# Patient Record
Sex: Male | Born: 1995 | Race: White | Hispanic: No | Marital: Single | Smoking: Never smoker
Health system: Southern US, Community
[De-identification: ages and names within clinical notes are randomized; demographics above are authoritative.]

---
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
## 2016-03-20 ENCOUNTER — Emergency Department (HOSPITAL_COMMUNITY): Payer: BLUE CROSS/BLUE SHIELD

## 2016-03-20 ENCOUNTER — Encounter (HOSPITAL_COMMUNITY): Payer: Self-pay | Admitting: Emergency Medicine

## 2016-03-20 ENCOUNTER — Emergency Department (HOSPITAL_COMMUNITY)
Admission: EM | Admit: 2016-03-20 | Discharge: 2016-03-20 | Disposition: A | Discharge status: Home / Self Care | Payer: BLUE CROSS/BLUE SHIELD | Attending: Emergency Medicine | Admitting: Emergency Medicine

## 2016-03-20 DIAGNOSIS — S43005S Unspecified dislocation of left shoulder joint, sequela: Secondary | ICD-10-CM | POA: Diagnosis not present

## 2016-03-20 DIAGNOSIS — Y9222 Religious institution as the place of occurrence of the external cause: Secondary | ICD-10-CM | POA: Diagnosis not present

## 2016-03-20 DIAGNOSIS — Y939 Activity, unspecified: Secondary | ICD-10-CM | POA: Insufficient documentation

## 2016-03-20 DIAGNOSIS — E860 Dehydration: Secondary | ICD-10-CM | POA: Diagnosis not present

## 2016-03-20 DIAGNOSIS — Y999 Unspecified external cause status: Secondary | ICD-10-CM | POA: Diagnosis not present

## 2016-03-20 DIAGNOSIS — W19XXXA Unspecified fall, initial encounter: Secondary | ICD-10-CM | POA: Insufficient documentation

## 2016-03-20 DIAGNOSIS — S0081XA Abrasion of other part of head, initial encounter: Secondary | ICD-10-CM | POA: Diagnosis not present

## 2016-03-20 DIAGNOSIS — R55 Syncope and collapse: Secondary | ICD-10-CM

## 2016-03-20 DIAGNOSIS — S4992XA Unspecified injury of left shoulder and upper arm, initial encounter: Secondary | ICD-10-CM | POA: Diagnosis present

## 2016-03-20 LAB — CBC
HCT: 45.1 % (ref 39.0–52.0)
HEMOGLOBIN: 15.3 g/dL (ref 13.0–17.0)
MCH: 31.4 pg (ref 26.0–34.0)
MCHC: 33.9 g/dL (ref 30.0–36.0)
MCV: 92.6 fL (ref 78.0–100.0)
PLATELETS: 210 10*3/uL (ref 150–400)
RBC: 4.87 MIL/uL (ref 4.22–5.81)
RDW: 12.2 % (ref 11.5–15.5)
WBC: 16.7 10*3/uL — AB (ref 4.0–10.5)

## 2016-03-20 LAB — URINALYSIS, ROUTINE W REFLEX MICROSCOPIC
Bilirubin Urine: NEGATIVE
GLUCOSE, UA: NEGATIVE mg/dL
HGB URINE DIPSTICK: NEGATIVE
Ketones, ur: NEGATIVE mg/dL
LEUKOCYTES UA: NEGATIVE
Nitrite: NEGATIVE
PROTEIN: NEGATIVE mg/dL
SPECIFIC GRAVITY, URINE: 1.017 (ref 1.005–1.030)
pH: 6 (ref 5.0–8.0)

## 2016-03-20 LAB — CBG MONITORING, ED: Glucose-Capillary: 73 mg/dL (ref 65–99)

## 2016-03-20 LAB — BASIC METABOLIC PANEL
ANION GAP: 6 (ref 5–15)
BUN: 13 mg/dL (ref 6–20)
CHLORIDE: 107 mmol/L (ref 101–111)
CO2: 25 mmol/L (ref 22–32)
Calcium: 9.3 mg/dL (ref 8.9–10.3)
Creatinine, Ser: 0.91 mg/dL (ref 0.61–1.24)
GFR calc non Af Amer: >60 mL/min (ref >60)
Glucose, Bld: 85 mg/dL (ref 65–99)
Potassium: 4.3 mmol/L (ref 3.5–5.1)
SODIUM: 138 mmol/L (ref 135–145)

## 2016-03-20 LAB — CK: CK TOTAL: 188 U/L (ref 49–397)

## 2016-03-20 MED ORDER — SODIUM CHLORIDE 0.9 % IV SOLN
1000.0000 mL | Freq: Once | INTRAVENOUS | Status: AC
  Administered 2016-03-20: 1000 mL via INTRAVENOUS

## 2016-03-20 MED ORDER — SODIUM CHLORIDE 0.9 % IV SOLN: 1000.0000 mL | INTRAVENOUS | Status: DC

## 2016-03-20 NOTE — ED Provider Notes (Signed)
CSN: [id number]     Arrival date & time 03/20/16  1053 History   None    Chief Complaint  Patient presents with  . Loss of Consciousness     (Consider location/radiation/quality/duration/timing/severity/associated sxs/prior Treatment) HPI Patient was standing outside a church greeting. He reports he had been standing in the sun for about 15 or 20 minutes. Patient began to feel lightheaded and shaky. He became weak and started to pass out. His brother was nearby and was able to partially catch him however the patient did fall striking the side of his head and injuring his left shoulder. Patient has severe pain in his left shoulder and it appeared dislocated. When medics were evaluated him and set him up right, the shoulder popped and went back into place. The patient reports that the pain was much relieved after that occurred. Patient reports he had only had a [facility] this morning and had nothing to eat. He also reports that he thinks he is dehydrated because he worked outside in the sun for about 6 hours yesterday as well. It was no chest pain or headache or palpitations preceding this episode. History reviewed. No pertinent past medical history. History reviewed. No pertinent past surgical history. No family history on file. Social History  Substance Use Topics  . Smoking status: Never Smoker   . Smokeless tobacco: None  . Alcohol Use: No    Review of Systems  10 Systems reviewed and are negative for acute change except as noted in the HPI.   Allergies  Review of patient's allergies indicates no known allergies.  Home Medications   Prior to Admission medications   Not on File   BP 115/60 mmHg  Pulse 63  Temp(Src) 97.6 F (36.4 C) (Oral)  Resp 16  SpO2 100% Physical Exam  Constitutional: He is oriented to person, place, and time. He appears well-developed and well-nourished. No distress.  HENT:  Right Ear: External ear normal.  Left Ear: External ear normal.  Nose:  Nose normal.  Mouth/Throat: Oropharynx is clear and moist.  Minor petechial abrasion to the right forehead. Hematoma. No palpable scalp or skull defects.  Eyes: EOM are normal. Pupils are equal, round, and reactive to light.  Neck: Neck supple.  No cervical spine tenderness.  Cardiovascular: Normal rate, regular rhythm, normal heart sounds and intact distal pulses.   Pulmonary/Chest: Effort normal and breath sounds normal.  Abdominal: Soft. Bowel sounds are normal. He exhibits no distension. There is no tenderness.  Musculoskeletal: Normal range of motion. He exhibits no edema.  Left shoulder shows no deformity. Patient does not have palpation tenderness. Mild tenderness with forward flexion.  Neurological: He is alert and oriented to person, place, and time. He has normal strength. No cranial nerve deficit. He exhibits normal muscle tone. Coordination normal. GCS eye subscore is 4. GCS verbal subscore is 5. GCS motor subscore is 6.  Skin: Skin is warm, dry and intact.  Psychiatric: He has a normal mood and affect.    ED Course  Procedures (including critical care time) Labs Review Labs Reviewed  CBC - Abnormal; Notable for the following:    WBC 16.7 (*)    All other components within normal limits  BASIC METABOLIC PANEL  URINALYSIS, ROUTINE W REFLEX MICROSCOPIC (NOT AT [facility])  CK  CBG MONITORING, ED    Imaging Review Dg Shoulder Left  03/20/2016  CLINICAL DATA:  Syncopal episode with left shoulder injury and pain, initial encounter EXAM: LEFT SHOULDER - 2+ VIEW  COMPARISON:  None. FINDINGS: There is no evidence of fracture or dislocation. There is no evidence of arthropathy or other focal bone abnormality. Soft tissues are unremarkable. IMPRESSION: No acute abnormality noted. Electronically Signed   By: Alcide CleverMark  Lukens M.D.   On: 03/20/2016 12:10   I have personally reviewed and evaluated these images and lab results as part of my medical decision-making.   EKG  Interpretation   Date/Time:  Sunday March 20 2016 11:21:23 EDT Ventricular Rate:  64 PR Interval:    QRS Duration: 90 QT Interval:  403 QTC Calculation: 416 R Axis:   109 Text Interpretation:  Sinus rhythm Borderline right axis deviation ST  elev, probable normal early repol pattern agree. Confirmed by Donnald GarrePfeiffer,  MD, Lebron ConnersMarcy [phone]([id number]) on 03/20/2016 1:02:14 PM      MDM   Final diagnoses:  Syncope and collapse  Dehydration  Shoulder dislocation, left, sequela   Patient had syncopal episode after standing outside in the sun. He also had not eaten this morning and had extensive physical activity yesterday. Patient is otherwise healthy. Review systems is negative for acute illness. When the patient fell, by history he dislocated his left shoulder. This spontaneously reduced in the field. At this time, he has normal function and minimal pain. He is counseled on not elevating the arm above the level of his chest. He will limit activity to chest height are motion on the left until recheck at the end of the week with his family provider. Patient had extensive outdoor physical activity with limited fluid intake over the past 24 hours. Syncope occurred without physical activity. This is consistent with dehydration and vasopressor syncope. Patient is counseled on rest and hydration over the next day.    Arby BarretteMarcy Raaga Maeder, MD 03/20/16 [phone]

## 2016-03-20 NOTE — Discharge Instructions (Signed)
Syncope °Syncope is a medical term for fainting or passing out. This means you lose consciousness and drop to the ground. People are generally unconscious for less than 5 minutes. You may have some muscle twitches for up to 15 seconds before waking up and returning to normal. Syncope occurs more often in older adults, but it can happen to anyone. While most causes of syncope are not dangerous, syncope can be a sign of a serious medical problem. It is important to seek medical care.  °CAUSES  °Syncope is caused by a sudden drop in blood flow to the brain. The specific cause is often not determined. Factors that can bring on syncope include: °· Taking medicines that lower blood pressure. °· Sudden changes in posture, such as standing up quickly. °· Taking more medicine than prescribed. °· Standing in one place for too long. °· Seizure disorders. °· Dehydration and excessive exposure to heat. °· Low blood sugar (hypoglycemia). °· Straining to have a bowel movement. °· Heart disease, irregular heartbeat, or other circulatory problems. °· Fear, emotional distress, seeing blood, or severe pain. °SYMPTOMS  °Right before fainting, you may: °· Feel dizzy or light-headed. °· Feel nauseous. °· See all white or all black in your field of vision. °· Have cold, clammy skin. °DIAGNOSIS  °Your health care provider will ask about your symptoms, perform a physical exam, and perform an electrocardiogram (ECG) to record the electrical activity of your heart. Your health care provider may also perform other heart or blood tests to determine the cause of your syncope which may include: °· Transthoracic echocardiogram (TTE). During echocardiography, sound waves are used to evaluate how blood flows through your heart. °· Transesophageal echocardiogram (TEE). °· Cardiac monitoring. This allows your health care provider to monitor your heart rate and rhythm in real time. °· Holter monitor. This is a portable device that records your  heartbeat and can help diagnose heart arrhythmias. It allows your health care provider to track your heart activity for several days, if needed. °· Stress tests by exercise or by giving medicine that makes the heart beat faster. °TREATMENT  °In most cases, no treatment is needed. Depending on the cause of your syncope, your health care provider may recommend changing or stopping some of your medicines. °HOME CARE INSTRUCTIONS °· Have someone stay with you until you feel stable. °· Do not drive, use machinery, or play sports until your health care provider says it is okay. °· Keep all follow-up appointments as directed by your health care provider. °· Lie down right away if you start feeling like you might faint. Breathe deeply and steadily. Wait until all the symptoms have passed. °· Drink enough fluids to keep your urine clear or pale yellow. °· If you are taking blood pressure or heart medicine, get up slowly and take several minutes to sit and then stand. This can reduce dizziness. °SEEK IMMEDIATE MEDICAL CARE IF:  °· You have a severe headache. °· You have unusual pain in the chest, abdomen, or back. °· You are bleeding from your mouth or rectum, or you have black or tarry stool. °· You have an irregular or very fast heartbeat. °· You have pain with breathing. °· You have repeated fainting or seizure-like jerking during an episode. °· You faint when sitting or lying down. °· You have confusion. °· You have trouble walking. °· You have severe weakness. °· You have vision problems. °If you fainted, call your local emergency services (911 in [place] Do not drive   yourself to the hospital.    This information is not intended to replace advice given to you by your health care provider. Make sure you discuss any questions you have with your health care provider.   Document Released: 09/05/2005 Document Revised: 01/20/2015 Document Reviewed: 11/04/2011 Elsevier Interactive Patient Education 2016 Else[employer] Shoulder Dislocation By your history, your shoulder was dislocated and spontaneously went back into position. You must not raise your arm above 90 degrees for the next week. Do not raise your arm to take off  your shirt or wash your hair. You may lift things and place them at the level of your chest. Not lift anything over your head. Recheck with your family doctor at the end of the week to determine if you are ready to go back to full range of motion activities. A shoulder dislocation happens when the upper arm bone (humerus) moves out of the shoulder joint. The shoulder joint is the part of the shoulder where the humerus, shoulder blade (scapula), and collarbone (clavicle) meet. CAUSES This condition is often caused by:  A fall.  A hit to the shoulder.  A forceful movement of the shoulder. RISK FACTORS This condition is more likely to develop in people who play sports. SYMPTOMS Symptoms of this condition include:  Deformity of the shoulder.  Intense pain.  Inability to move the shoulder.  Numbness, weakness, or tingling in your neck or down your arm.  Bruising or swelling around your shoulder. DIAGNOSIS This condition is diagnosed with a physical exam. After the exam, tests may be done to check for related problems. Tests that may be done include:  X-ray. This may be done to check for broken bones.  MRI. This may be done to check for damage to the tissues around the shoulder.  Electromyogram. This may be done to check for nerve damage. TREATMENT This condition is treated with a procedure to place the humerus back in the joint. This procedure is called a reduction. There are two types of reduction:  Closed reduction. In this procedure, the humerus is placed back in the joint without surgery. The health care provider uses his or her hands to guide the bone back into place.  Open reduction. In this procedure, the humerus is placed back in the joint with surgery. An open  reduction may be recommended if:  You have a weak shoulder joint or weak ligaments.  You have had more than one shoulder dislocation.  The nerves or blood vessels around your shoulder have been damaged. After the humerus is placed back into the joint, your arm will be placed in a splint or sling to prevent it from moving. You will need to wear the splint or sling until your shoulder heals. When the splint or sling is removed, you may have physical therapy to help improve the range of motion in your shoulder joint. HOME CARE INSTRUCTIONS If You Have a Splint or Sling:  Wear it as told by your health care provider. Remove it only as told by your health care provider.  Loosen it if your fingers become numb and tingle, or if they turn cold and blue.  Keep it clean and dry. Bathing  Do not take baths, swim, or use a hot tub until your health care provider approves. Ask your health care provider if you can take showers. You may only be allowed to take sponge baths for bathing.  If your health care provider approves bathing and showering, cover your splint or  sling with a watertight plastic bag to protect it from water. Do not let the splint or sling get wet. Managing Pain, Stiffness, and Swelling  If directed, apply ice to the injured area.  Put ice in a plastic bag.  Place a towel between your skin and the bag.  Leave the ice on for 20 minutes, 2-3 times per day.  Move your fingers often to avoid stiffness and to decrease swelling.  Raise (elevate) the injured area above the level of your heart while you are sitting or lying down. Driving  Do not drive while wearing a splint or sling on a hand that you use for driving.  Do not drive or operate heavy machinery while taking pain medicine. Activity  Return to your normal activities as told by your health care provider. Ask your health care provider what activities are safe for you.  Perform range-of-motion exercises only as told by  your health care provider.  Exercise your hand by squeezing a soft ball. This helps to decrease stiffness and swelling in your hand and wrist. General Instructions  Take over-the-counter and prescription medicines only as told by your health care provider.  Do not use any tobacco products, including cigarettes, chewing tobacco, or e-cigarettes. Tobacco can delay bone and tissue healing. If you need help quitting, ask your health care provider.  Keep all follow-up visits as told by your health care provider. This is important. SEEK MEDICAL CARE IF:  Your splint or sling gets damaged. SEEK IMMEDIATE MEDICAL CARE IF:  Your pain gets worse rather than better.  You lose feeling in your arm or hand.  Your arm or hand becomes white and cold.   This information is not intended to replace advice given to you by your health care provider. Make sure you discuss any questions you have with your health care provider.   Document Released: 05/31/2001 Document Revised: 05/27/2015 Document Reviewed: 12/29/2014 Elsevier Interactive Patient Education [employer]

## 2016-03-20 NOTE — ED Notes (Signed)
Report from [facility]> Pt was at church shaking hands and had a syncopal episode that lasted approx 30 seconds.  Family reported he fell on L shoulder and started shaking.  No urinary incontinence.  Reports decreased PO intake yesterday.  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  Pt didn't eat this morning and drank a [place] prior to church.  EMS reports ? Dislocated Left shoulder- state they heard it pop back into place when they were getting him on stretcher.  Pt has abrasions to knuckles and R forehead.  States only complaint right now is soreness to L shoulder.

## 2016-03-20 NOTE — ED Notes (Signed)
Patient able to ambulate independently  

## 2017-05-31 IMAGING — CR DG SHOULDER 2+V*L*
2 series · 2 of 2 positions shown · non-contrast
Comparison: None.

CLINICAL DATA: Syncopal episode with left shoulder injury and pain,
initial encounter

EXAM:
LEFT SHOULDER - 2+ VIEW

[shoulder grashey]
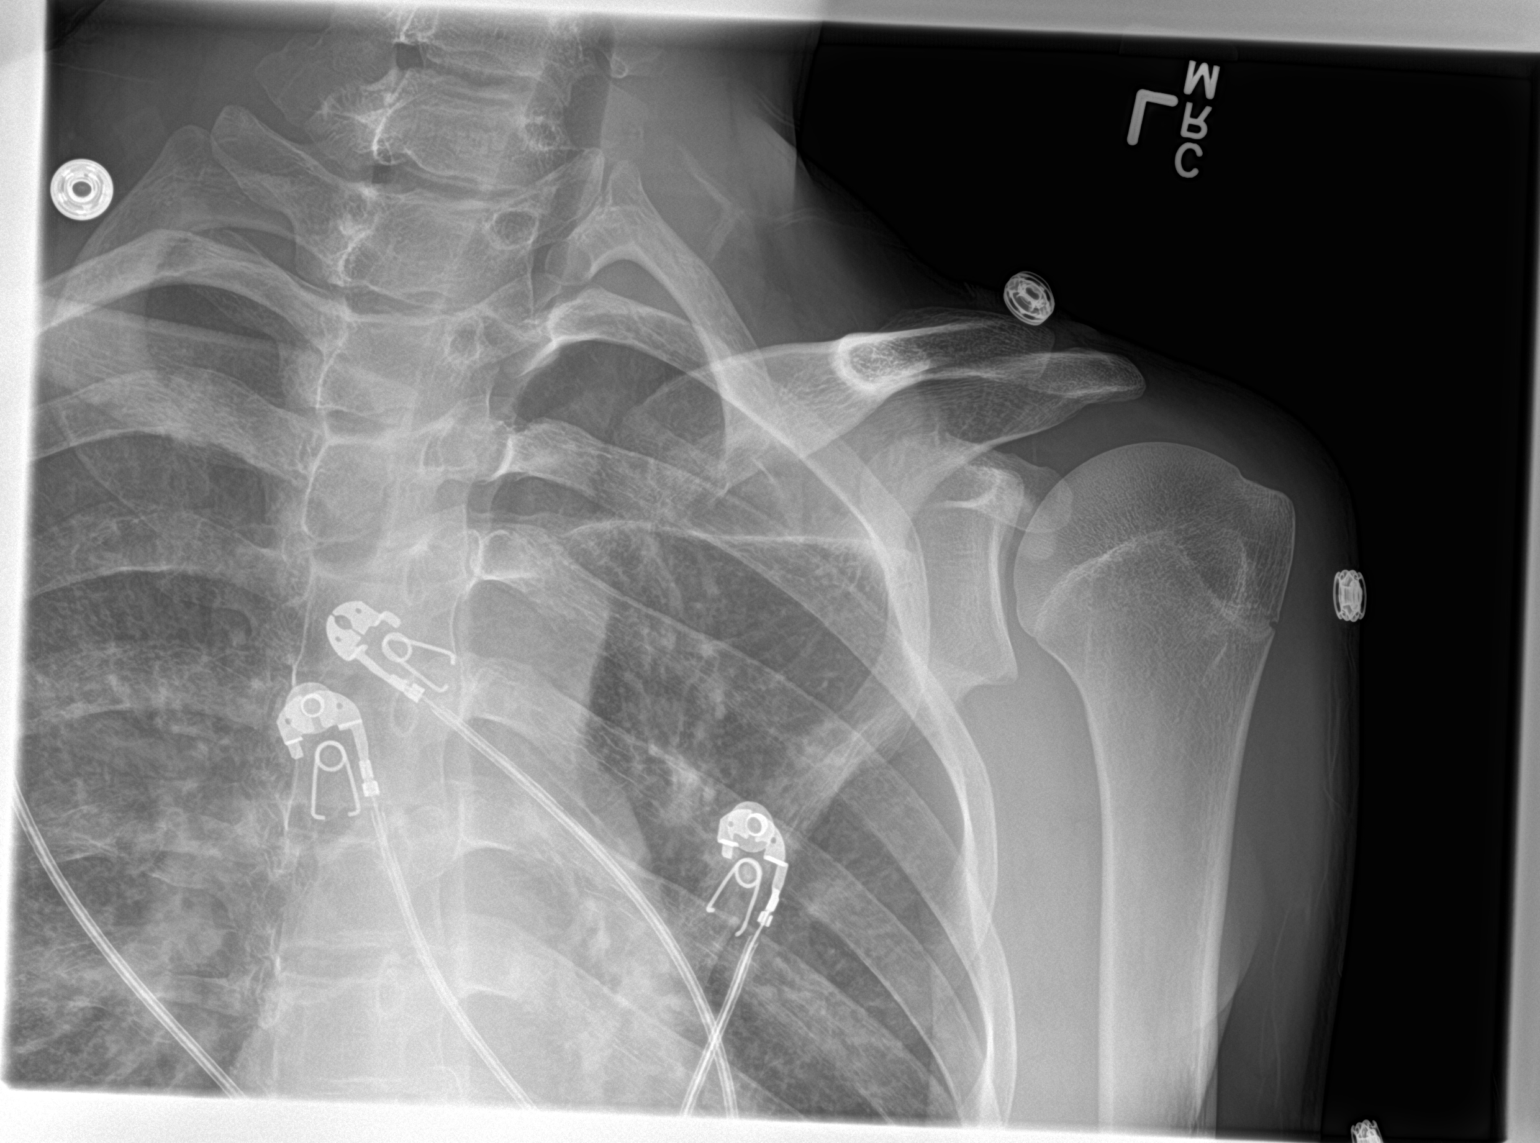

[shoulder y view]
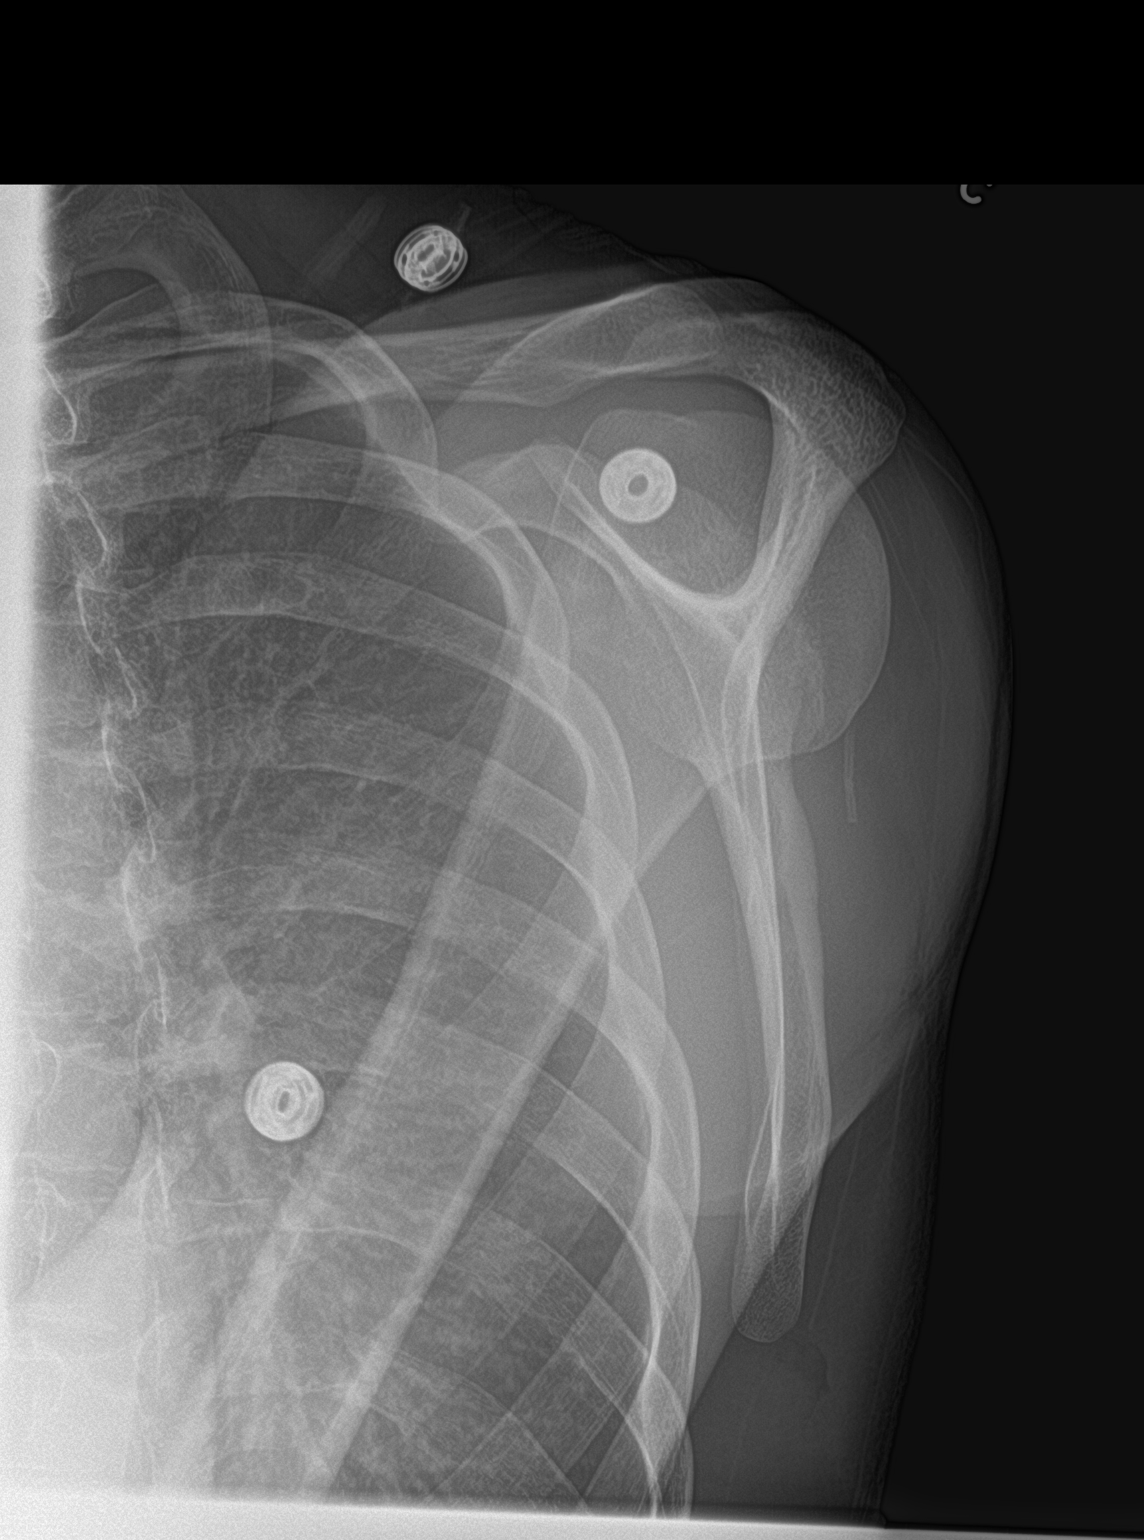

[2 of 2 positions shown; findings below may reference images not displayed]

FINDINGS: There is no evidence of fracture or dislocation. There is no
evidence of arthropathy or other focal bone abnormality. Soft
tissues are unremarkable.
IMPRESSION: No acute abnormality noted.
